# Patient Record
Sex: Male | Born: 1935 | Hispanic: No | Marital: Single | Smoking: Never smoker
Health system: Southern US, Community
[De-identification: ages and names within clinical notes are randomized; demographics above are authoritative.]

## PROBLEM LIST (undated history)

## (undated) DIAGNOSIS — H4089 Other specified glaucoma: Secondary | ICD-10-CM

## (undated) DIAGNOSIS — I10 Essential (primary) hypertension: Secondary | ICD-10-CM

## (undated) DIAGNOSIS — M1990 Unspecified osteoarthritis, unspecified site: Secondary | ICD-10-CM

## (undated) DIAGNOSIS — E119 Type 2 diabetes mellitus without complications: Secondary | ICD-10-CM

## (undated) DIAGNOSIS — J449 Chronic obstructive pulmonary disease, unspecified: Secondary | ICD-10-CM

## (undated) DIAGNOSIS — I639 Cerebral infarction, unspecified: Secondary | ICD-10-CM

---

## 2003-06-13 ENCOUNTER — Ambulatory Visit (HOSPITAL_BASED_OUTPATIENT_CLINIC_OR_DEPARTMENT_OTHER): Admission: RE | Admit: 2003-06-13 | Discharge: 2003-06-13 | Payer: Self-pay | Admitting: Surgery

## 2005-02-11 ENCOUNTER — Encounter: Admission: RE | Admit: 2005-02-11 | Discharge: 2005-02-11 | Payer: Self-pay | Admitting: Surgery

## 2005-03-11 ENCOUNTER — Encounter: Admission: RE | Admit: 2005-03-11 | Discharge: 2005-03-11 | Payer: Self-pay | Admitting: Surgery

## 2005-04-02 ENCOUNTER — Encounter: Admission: RE | Admit: 2005-04-02 | Discharge: 2005-04-02 | Payer: Self-pay | Admitting: Surgery

## 2005-12-21 ENCOUNTER — Inpatient Hospital Stay (HOSPITAL_COMMUNITY): Admission: RE | Admit: 2005-12-21 | Discharge: 2005-12-25 | Payer: Self-pay | Admitting: Orthopedic Surgery

## 2005-12-21 ENCOUNTER — Ambulatory Visit: Payer: Self-pay | Admitting: Cardiology

## 2017-12-09 ENCOUNTER — Ambulatory Visit
Admission: RE | Admit: 2017-12-09 | Discharge: 2017-12-09 | Disposition: A | Discharge status: Home / Self Care | Payer: Medicare Other | Source: Ambulatory Visit | Attending: Cardiology | Admitting: Cardiology

## 2017-12-09 ENCOUNTER — Other Ambulatory Visit: Payer: Self-pay | Admitting: Cardiology

## 2017-12-09 DIAGNOSIS — R42 Dizziness and giddiness: Secondary | ICD-10-CM | POA: Insufficient documentation

## 2017-12-09 DIAGNOSIS — I10 Essential (primary) hypertension: Secondary | ICD-10-CM | POA: Diagnosis present

## 2017-12-09 MED ORDER — GADOBENATE DIMEGLUMINE 529 MG/ML IV SOLN
16.0000 mL | Freq: Once | INTRAVENOUS | Status: AC | PRN
  Administered 2017-12-09: 16 mL via INTRAVENOUS

## 2018-05-19 ENCOUNTER — Other Ambulatory Visit: Payer: Self-pay | Admitting: Internal Medicine

## 2018-05-19 DIAGNOSIS — R110 Nausea: Secondary | ICD-10-CM

## 2018-05-19 DIAGNOSIS — R634 Abnormal weight loss: Secondary | ICD-10-CM

## 2018-05-19 DIAGNOSIS — R1013 Epigastric pain: Secondary | ICD-10-CM

## 2018-05-24 ENCOUNTER — Ambulatory Visit
Admission: RE | Admit: 2018-05-24 | Discharge: 2018-05-24 | Disposition: A | Discharge status: Home / Self Care | Payer: Medicare Other | Source: Ambulatory Visit | Attending: Internal Medicine | Admitting: Internal Medicine

## 2018-05-24 DIAGNOSIS — R1013 Epigastric pain: Secondary | ICD-10-CM | POA: Insufficient documentation

## 2018-05-24 DIAGNOSIS — R634 Abnormal weight loss: Secondary | ICD-10-CM | POA: Diagnosis present

## 2018-05-24 DIAGNOSIS — K4090 Unilateral inguinal hernia, without obstruction or gangrene, not specified as recurrent: Secondary | ICD-10-CM | POA: Insufficient documentation

## 2018-05-24 DIAGNOSIS — I700 Atherosclerosis of aorta: Secondary | ICD-10-CM | POA: Insufficient documentation

## 2018-05-24 DIAGNOSIS — R110 Nausea: Secondary | ICD-10-CM | POA: Diagnosis present

## 2018-05-24 HISTORY — DX: Type 2 diabetes mellitus without complications: E11.9

## 2018-05-24 MED ORDER — IOPAMIDOL (ISOVUE-300) INJECTION 61%
100.0000 mL | Freq: Once | INTRAVENOUS | Status: AC | PRN
  Administered 2018-05-24: 100 mL via INTRAVENOUS

## 2020-07-08 NOTE — Progress Notes (Signed)
07/09/2020 6:02 PM   Steven Gould [birth year] [id number]  Referring provider: No referring provider defined for this encounter. Chief Complaint  Patient presents with   Hematuria    HPI: Steven Gould is a 86 y.o. male with hematuria who presents today for evaluation and management.   He has had several episodes of terminal hematuria a few times over the past few months without any other symptoms.   Voiding well, no complaints.     Never smoker.  The patient underwent a prostate biopsy in 1999 which was unremarkable. Most recent PSA is 0.87 as of 06/28/2020.  He was last seen at [facility] urology in 2016. Cystoscopy at that time showed a left bladder wall small submucosal hemorrhage. CT urogram was unremarkable.  CT A/P with contrast on 05/24/2018 no acute findings in the abdomen or pelvis. Mildly prominent prostate. Small right inguinal hernia containing fat.  UA on 06/28/2020 showed 2 RBC.  PSA trend: 04/17/13: 0.7 01/25/14: 0.9 12/03/14: 1.0 01/10/16: 0.70 02/19/17: 0.79 05/12/18: 0.62 06/28/20: 0.87  Never smoker.  PMH: Past Medical History:  Diagnosis Date   Diabetes mellitus without complication ([place])    Takes Metformin    Surgical History: History reviewed. No pertinent surgical history.  Home Medications:  Allergies as of 07/09/2020   No Known Allergies     Medication List       Accurate as of July 09, 2020  6:02 PM. If you have any questions, ask your nurse or doctor.        aspirin 81 MG EC tablet Take by mouth.   Calcium 500 MG tablet Take by mouth.   Cholecalciferol 50 MCG (2000 UT) Caps Take by mouth.   donepezil 10 MG tablet Commonly known as: ARICEPT Take 10 mg by mouth daily.   esomeprazole 40 MG capsule Commonly known as: NEXIUM Take 40 mg by mouth daily.   Fish Oil 1000 MG Caps Take by mouth.   glimepiride 2 MG tablet Commonly known as: AMARYL Take 2 mg by mouth daily.   hydrochlorothiazide 25 MG  tablet Commonly known as: HYDRODIURIL TK 1 T PO ONCE DAILY   irbesartan 300 MG tablet Commonly known as: AVAPRO Take 300 mg by mouth daily.   metFORMIN 500 MG tablet Commonly known as: GLUCOPHAGE Take two tablets (1000 mg) twice daily.   metoprolol succinate 100 MG 24 hr tablet Commonly known as: TOPROL-XL Take by mouth.   Omnipaque 300 MG/ML solution Generic drug: iohexol   ONE TOUCH ULTRA 2 w/Device Kit Use once daily to check blood sugars   OneTouch Delica Lancets 01S Misc Use 1 each once daily.   pioglitazone 30 MG tablet Commonly known as: ACTOS Take 30 mg by mouth daily.   Precision QID Test test strip Generic drug: glucose blood Use once daily Use as instructed. CONTOUR NEXT TEST STRIPS   spironolactone 25 MG tablet Commonly known as: ALDACTONE Take 25 mg by mouth daily.   zolpidem 5 MG tablet Commonly known as: AMBIEN Take 1/2 - 1 tablet nightly as needed.       Allergies: No Known Allergies  Family History: History reviewed. No pertinent family history.  Social History:  has no history on file for tobacco use, alcohol use, and drug use.   Physical Exam: BP (!) 172/90    Pulse 66    Ht '5\' 9"'$  (1.753 m)    Wt 175 lb (79.4 kg)    BMI 25.84 kg/m   Constitutional:  Alert and oriented, No acute  distress. HEENT: [place] AT, moist mucus membranes.  Trachea midline, no masses. Cardiovascular: No clubbing, cyanosis, or edema. Respiratory: Normal respiratory effort, no increased work of breathing Skin: No rashes, bruises or suspicious lesions. Neurologic: Grossly intact, no focal deficits, moving all 4 extremities. Psychiatric: Normal mood and affect.  Urinalysis No microscopic hematuria  Assessment & Plan:    1. Gross hematuria  Suspected to be prostate related. Patient underwent extensive work up in 2016 (see above). Episodes happen only a few times terminally at the end of voiding. Patient has agreed to a cysto today. RUS for upper tract imaging     [facility] [address], [place], [place] [address] [phone]  I, Selena Batten, am acting as a scribe for Dr. Hollice Espy.  I have reviewed the above documentation for accuracy and completeness, and I agree with the above.   Hollice Espy, MD   I spent 45 total minutes on the day of the encounter including pre-visit review of the medical record ([facility]/ care everywhere), face-to-face time with the patient, and post visit ordering of labs/imaging/tests.

## 2020-07-09 ENCOUNTER — Encounter: Payer: Self-pay | Admitting: Urology

## 2020-07-09 ENCOUNTER — Ambulatory Visit (INDEPENDENT_AMBULATORY_CARE_PROVIDER_SITE_OTHER): Payer: Medicare Other | Admitting: Urology

## 2020-07-09 ENCOUNTER — Other Ambulatory Visit: Payer: Self-pay

## 2020-07-09 VITALS — BP 172/90 | HR 66 | Ht 69.0 in | Wt 175.0 lb

## 2020-07-09 DIAGNOSIS — R319 Hematuria, unspecified: Secondary | ICD-10-CM | POA: Diagnosis not present

## 2020-07-09 NOTE — Progress Notes (Signed)
   07/09/2020  CC:  Chief Complaint  Patient presents with  . Hematuria    HPI: Steven Gould is a 86 y.o. with gross hematuria who presents for a cystoscopy.   See associated note.   Blood pressure (!) 172/90, pulse 66, height 5\' 9"  (1.753 m), weight 175 lb (79.4 kg). NED. A&Ox3.   No respiratory distress   Abd soft, NT, ND Normal phallus with bilateral descended testicles  Cystoscopy Procedure Note  Patient identification was confirmed, informed consent was obtained, and patient was prepped using Betadine solution.  Lidocaine jelly was administered per urethral meatus.     Pre-Procedure: - Inspection reveals a normal caliber ureteral meatus.  Procedure: The flexible cystoscope was introduced without difficulty - No urethral strictures/lesions are present. - Mildly enlarged prostate  - Normal bladder neck - Bilateral ureteral orifices identified - Bladder mucosa  reveals no ulcers, tumors, or lesions - No bladder stones - No trabeculation  Retroflexion was unremarkable.    Post-Procedure: - Patient tolerated the procedure well  Assessment/ Plan:  1. Gross hematuria Cystoscopy is unremarkable    I, Selena Batten, am acting as a scribe for Dr. Hollice Espy.  I have reviewed the above documentation for accuracy and completeness, and I agree with the above.   Hollice Espy, MD

## 2020-07-11 LAB — MICROSCOPIC EXAMINATION: Bacteria, UA: NONE SEEN

## 2020-07-11 LAB — URINALYSIS, COMPLETE
Bilirubin, UA: NEGATIVE
Glucose, UA: NEGATIVE
Ketones, UA: NEGATIVE
Leukocytes,UA: NEGATIVE
Nitrite, UA: NEGATIVE
Protein,UA: NEGATIVE
Specific Gravity, UA: 1.02 (ref 1.005–1.030)
Urobilinogen, Ur: 0.2 mg/dL (ref 0.2–1.0)
pH, UA: 5 (ref 5.0–7.5)

## 2020-07-15 ENCOUNTER — Telehealth: Payer: Self-pay | Admitting: Urology

## 2020-07-15 NOTE — Telephone Encounter (Signed)
RUS is fine.  No pathology.  Incidental known peripelvic cysts (renal cysts).    Hollice Espy, MD

## 2020-07-23 NOTE — Telephone Encounter (Signed)
Patient informed, voiced understanding.  °

## 2022-01-20 ENCOUNTER — Ambulatory Visit
Admission: RE | Admit: 2022-01-20 | Discharge: 2022-01-20 | Disposition: A | Discharge status: Home / Self Care | Payer: Medicare Other | Source: Ambulatory Visit | Attending: Internal Medicine | Admitting: Internal Medicine

## 2022-01-20 ENCOUNTER — Other Ambulatory Visit: Payer: Self-pay | Admitting: Internal Medicine

## 2022-01-20 DIAGNOSIS — R634 Abnormal weight loss: Secondary | ICD-10-CM | POA: Insufficient documentation

## 2022-01-20 DIAGNOSIS — R630 Anorexia: Secondary | ICD-10-CM

## 2022-01-20 DIAGNOSIS — R1084 Generalized abdominal pain: Secondary | ICD-10-CM | POA: Diagnosis present

## 2022-01-20 DIAGNOSIS — R531 Weakness: Secondary | ICD-10-CM

## 2022-01-20 LAB — POCT I-STAT CREATININE: Creatinine, Ser: 1.5 mg/dL — ABNORMAL HIGH (ref 0.61–1.24)

## 2022-01-20 MED ORDER — IOHEXOL 300 MG/ML  SOLN
80.0000 mL | Freq: Once | INTRAMUSCULAR | Status: AC | PRN
  Administered 2022-01-20: 80 mL via INTRAVENOUS

## 2022-02-18 ENCOUNTER — Encounter: Payer: Self-pay | Admitting: Gastroenterology

## 2022-02-18 NOTE — H&P (Signed)
? ?Pre-Procedure H&P ?  ?Patient ID: Steven Gould is a 88 y.o. male. ? ?Gastroenterology Provider: Annamaria Helling, DO ? ?Referring Provider: Laurine Blazer, PA ?PCP: Patient, No Pcp Per (Inactive) ? ?Date: 02/19/2022 ? ?HPI ?Steven Gould is a 88 y.o. male who presents today for Esophagogastroduodenoscopy for early satiety, weight loss, abnormal CT imaging. ?Patient is a retired Research scientist (medical). ?Has noted for several months early satiety with decreasing appetite.  He has a weight loss of 20 to 30 pounds.  In March 2022 he weighed 169 pounds.  February of this year he was down to 144 pounds but has improved some at 151 pounds. ?He denies dysphagia odynophagia nausea vomiting and GERD symptoms.  He has no abdominal pain.  He has not regularly been on PPI in 10 years.  He denies any NSAID use. ?Bowel movements have been regular with no diarrhea constipation melena or hematochezia ? ?No personal history of polyps.  Last colonoscopy 9 years ago.  No family history of colorectal cancer or colon polyps ?Most recent lab values total bili 1.3 ferritin 266 hemoglobin 14.7 creatinine 1.5 MCV 97 platelets 177,000 ? ?No other acute GI complaints. ? ? He underwent CT scan demonstrating symmetric wall thickening of the distal esophagus.  Family history 1 brother with throat cancer another brother with gastric cancer. ? ? ? ?Past Medical History:  ?Diagnosis Date  ? Arthritis   ? COPD (chronic obstructive pulmonary disease) ([place])   ? Diabetes mellitus without complication ([place])   ? Takes Metformin  ? 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 Hypertension   ? Stroke [facility])   ? ? ?Past Surgical History:  ?Procedure Laterality Date  ? EYE SURGERY    ? glaucoma eye surgery  ? HERNIA REPAIR    ? JOINT REPLACEMENT    ? left hand ganglion cyst removal    ? TOTAL KNEE ARTHROPLASTY Right 2006  ? ? ?Family History ?1 brother with throat cancer, another brother with gastric cancer ?No other h/o GI  disease or malignancy ? ?Review of Systems  ?Constitutional:  Positive for appetite change (Diminished), fatigue and unexpected weight change (Lost 20-30 pounds). Negative for activity change, chills, diaphoresis and fever.  ?HENT:  Negative for trouble swallowing and voice change.   ?Respiratory:  Negative for shortness of breath and wheezing.   ?Cardiovascular:  Negative for chest pain, palpitations and leg swelling.  ?Gastrointestinal:  Negative for abdominal distention, abdominal pain, anal bleeding, blood in stool, constipation, diarrhea, nausea and vomiting.  ?     Early satiety  ?Musculoskeletal:  Negative for arthralgias and myalgias.  ?Skin:  Negative for color change and pallor.  ?Neurological:  Negative for dizziness, syncope and weakness.  ?Psychiatric/Behavioral:  Negative for confusion. The patient is not nervous/anxious.   ?All other systems reviewed and are negative.  ? ?Medications ?No current facility-administered medications on file prior to encounter.  ? ?Current Outpatient Medications on File Prior to Encounter  ?Medication Sig Dispense Refill  ? donepezil (ARICEPT) 10 MG tablet Take 10 mg by mouth daily.    ? glimepiride (AMARYL) 2 MG tablet Take 2 mg by mouth daily.    ? hydrochlorothiazide (HYDRODIURIL) 25 MG tablet TK 1 T PO ONCE DAILY    ? irbesartan (AVAPRO) 300 MG tablet Take 300 mg by mouth daily.    ? metFORMIN (GLUCOPHAGE) 500 MG tablet Take two tablets (1000 mg) twice daily.    ? metoprolol succinate (TOPROL-XL) 100 MG 24 hr tablet Take  by mouth.    ? mirtazapine (REMERON) 15 MG tablet Take 15 mg by mouth at bedtime.    ? Multiple Vitamin (MULTIVITAMIN) tablet Take 1 tablet by mouth daily.    ? aspirin 81 MG EC tablet Take by mouth.    ? Blood Glucose Monitoring Suppl (ONE TOUCH ULTRA 2) w/Device KIT Use once daily to check blood sugars    ? Calcium 500 MG tablet Take by mouth.    ? Cholecalciferol 50 MCG (2000 UT) CAPS Take by mouth.    ? esomeprazole (NEXIUM) 40 MG capsule Take 40  mg by mouth daily.    ? iohexol (OMNIPAQUE) 300 MG/ML solution     ? Omega-3 Fatty Acids (FISH OIL) 1000 MG CAPS Take by mouth.    ? OneTouch Delica Lancets 68L MISC Use 1 each once daily.    ? pioglitazone (ACTOS) 30 MG tablet Take 30 mg by mouth daily.    ? spironolactone (ALDACTONE) 25 MG tablet Take 25 mg by mouth daily.    ? zolpidem (AMBIEN) 5 MG tablet Take 1/2 - 1 tablet nightly as needed.    ? ? ?Pertinent medications related to GI and procedure were reviewed by me with the patient prior to the procedure ? ? ?Current Facility-Administered Medications:  ?  0.9 %  sodium chloride infusion, , Intravenous, Continuous, Annamaria Helling, DO ?  ?  ? ?No Known Allergies ?Allergies were reviewed by me prior to the procedure ? ?Objective  ? ? ?Vitals:  ? 02/19/22 0953  ?BP: (!) 165/88  ?Pulse: 65  ?Resp: 18  ?Temp: (!) 96.9 ?F (36.1 ?C)  ?TempSrc: Temporal  ?SpO2: 100%  ?Weight: 63.5 kg  ?Height: 5' 9.5" (1.765 m)  ? ? ? ?Physical Exam ?Vitals and nursing note reviewed.  ?Constitutional:   ?   General: He is not in acute distress. ?   Appearance: Normal appearance. He is not ill-appearing, toxic-appearing or diaphoretic.  ?HENT:  ?   Head: Normocephalic and atraumatic.  ?   Nose: Nose normal.  ?   Mouth/Throat:  ?   Mouth: Mucous membranes are moist.  ?   Pharynx: Oropharynx is clear.  ?Eyes:  ?   General: No scleral icterus. ?   Extraocular Movements: Extraocular movements intact.  ?Cardiovascular:  ?   Rate and Rhythm: Normal rate and regular rhythm.  ?   Heart sounds: Normal heart sounds. No murmur heard. ?  No friction rub. No gallop.  ?Pulmonary:  ?   Effort: Pulmonary effort is normal. No respiratory distress.  ?   Breath sounds: Normal breath sounds. No wheezing, rhonchi or rales.  ?Abdominal:  ?   General: Bowel sounds are normal. There is no distension.  ?   Palpations: Abdomen is soft.  ?   Tenderness: There is no abdominal tenderness. There is no guarding or rebound.  ?Musculoskeletal:  ?   Cervical  back: Neck supple.  ?   Right lower leg: No edema.  ?   Left lower leg: No edema.  ?Skin: ?   General: Skin is warm and dry.  ?   Coloration: Skin is not jaundiced or pale.  ?Neurological:  ?   General: No focal deficit present.  ?   Mental Status: He is alert and oriented to person, place, and time. Mental status is at baseline.  ?Psychiatric:     ?   Mood and Affect: Mood normal.     ?   Behavior: Behavior normal.     ?  Thought Content: Thought content normal.     ?   Judgment: Judgment normal.  ? ? ? ?Assessment:  ?Mr. Keylor Rands is a 88 y.o. male  who presents today for Esophagogastroduodenoscopy for early satiety, weight loss, abnormal CT imaging. ? ?Plan:  ?Esophagogastroduodenoscopy with possible intervention today ? ?Esophagogastroduodenoscopy with possible biopsy, control of bleeding, polypectomy, and interventions as necessary has been discussed with the patient/patient representative. Informed consent was obtained from the patient/patient representative after explaining the indication, nature, and risks of the procedure including but not limited to death, bleeding, perforation, missed neoplasm/lesions, cardiorespiratory compromise, and reaction to medications. Opportunity for questions was given and appropriate answers were provided. Patient/patient representative has verbalized understanding is amenable to undergoing the procedure. ? ? ?Annamaria Helling, DO  ?[facility] ? ?Portions of the record may have been created with voice recognition software. Occasional wrong-word or 'sound-a-like' substitutions may have occurred due to the inherent limitations of voice recognition software.  Read the chart carefully and recognize, using context, where substitutions may have occurred. ?

## 2022-02-19 ENCOUNTER — Ambulatory Visit: Payer: Medicare Other | Admitting: Anesthesiology

## 2022-02-19 ENCOUNTER — Ambulatory Visit
Admission: RE | Admit: 2022-02-19 | Discharge: 2022-02-19 | Disposition: A | Discharge status: Home / Self Care | Payer: Medicare Other | Source: Ambulatory Visit | Attending: Gastroenterology | Admitting: Gastroenterology

## 2022-02-19 ENCOUNTER — Encounter: Payer: Self-pay | Admitting: Gastroenterology

## 2022-02-19 ENCOUNTER — Encounter
Admission: RE | Disposition: A | Discharge status: Home / Self Care | Payer: Self-pay | Source: Ambulatory Visit | Attending: Gastroenterology

## 2022-02-19 DIAGNOSIS — R933 Abnormal findings on diagnostic imaging of other parts of digestive tract: Secondary | ICD-10-CM | POA: Diagnosis present

## 2022-02-19 DIAGNOSIS — R634 Abnormal weight loss: Secondary | ICD-10-CM | POA: Diagnosis not present

## 2022-02-19 DIAGNOSIS — K2980 Duodenitis without bleeding: Secondary | ICD-10-CM | POA: Diagnosis not present

## 2022-02-19 DIAGNOSIS — K2950 Unspecified chronic gastritis without bleeding: Secondary | ICD-10-CM | POA: Diagnosis not present

## 2022-02-19 DIAGNOSIS — E119 Type 2 diabetes mellitus without complications: Secondary | ICD-10-CM | POA: Diagnosis not present

## 2022-02-19 DIAGNOSIS — I10 Essential (primary) hypertension: Secondary | ICD-10-CM | POA: Insufficient documentation

## 2022-02-19 DIAGNOSIS — Z6820 Body mass index (BMI) 20.0-20.9, adult: Secondary | ICD-10-CM | POA: Insufficient documentation

## 2022-02-19 DIAGNOSIS — Z7984 Long term (current) use of oral hypoglycemic drugs: Secondary | ICD-10-CM | POA: Insufficient documentation

## 2022-02-19 DIAGNOSIS — Z800 Family history of malignant neoplasm of digestive organs: Secondary | ICD-10-CM | POA: Insufficient documentation

## 2022-02-19 DIAGNOSIS — K3189 Other diseases of stomach and duodenum: Secondary | ICD-10-CM | POA: Diagnosis not present

## 2022-02-19 DIAGNOSIS — Z8673 Personal history of transient ischemic attack (TIA), and cerebral infarction without residual deficits: Secondary | ICD-10-CM | POA: Diagnosis not present

## 2022-02-19 DIAGNOSIS — D132 Benign neoplasm of duodenum: Secondary | ICD-10-CM | POA: Diagnosis not present

## 2022-02-19 DIAGNOSIS — K317 Polyp of stomach and duodenum: Secondary | ICD-10-CM | POA: Diagnosis not present

## 2022-02-19 DIAGNOSIS — R6881 Early satiety: Secondary | ICD-10-CM | POA: Insufficient documentation

## 2022-02-19 HISTORY — DX: Cerebral infarction, unspecified: I63.9

## 2022-02-19 HISTORY — DX: Essential (primary) hypertension: I10

## 2022-02-19 HISTORY — DX: Chronic obstructive pulmonary disease, unspecified: J44.9

## 2022-02-19 HISTORY — DX: Unspecified osteoarthritis, unspecified site: M19.90

## 2022-02-19 HISTORY — PX: ESOPHAGOGASTRODUODENOSCOPY (EGD) WITH PROPOFOL: SHX5813

## 2022-02-19 HISTORY — DX: Other specified glaucoma: H40.89

## 2022-02-19 LAB — GLUCOSE, CAPILLARY: Glucose-Capillary: 140 mg/dL — ABNORMAL HIGH (ref 70–99)

## 2022-02-19 SURGERY — ESOPHAGOGASTRODUODENOSCOPY (EGD) WITH PROPOFOL
Anesthesia: General

## 2022-02-19 MED ORDER — PROPOFOL 500 MG/50ML IV EMUL
INTRAVENOUS | Status: AC
  Filled 2022-02-19: qty 50

## 2022-02-19 MED ORDER — EPHEDRINE 5 MG/ML INJ
INTRAVENOUS | Status: AC
  Filled 2022-02-19: qty 5

## 2022-02-19 MED ORDER — PROPOFOL 500 MG/50ML IV EMUL
INTRAVENOUS | Status: DC | PRN
  Administered 2022-02-19: 150 ug/kg/min via INTRAVENOUS

## 2022-02-19 MED ORDER — LIDOCAINE HCL (PF) 2 % IJ SOLN
INTRAMUSCULAR | Status: AC
  Filled 2022-02-19: qty 5

## 2022-02-19 MED ORDER — EPHEDRINE SULFATE (PRESSORS) 50 MG/ML IJ SOLN
INTRAMUSCULAR | Status: DC | PRN
  Administered 2022-02-19 (×2): 5 mg via INTRAVENOUS

## 2022-02-19 MED ORDER — LIDOCAINE HCL (CARDIAC) PF 100 MG/5ML IV SOSY
PREFILLED_SYRINGE | INTRAVENOUS | Status: DC | PRN
Start: 2022-02-19 — End: 2022-02-19
  Administered 2022-02-19: 50 mg via INTRAVENOUS

## 2022-02-19 MED ORDER — SODIUM CHLORIDE 0.9 % IV SOLN: INTRAVENOUS | Status: DC

## 2022-02-19 NOTE — Interval H&P Note (Signed)
History and Physical Interval Note: Preprocedure H&P from 02/19/22 ? was reviewed and there was no interval change after seeing and examining the patient.  Written consent was obtained from the patient after discussion of risks, benefits, and alternatives. Patient has consented to proceed with Esophagogastroduodenoscopy with possible intervention ? ? ?02/19/2022 ?10:17 AM ? ?Steven Gould  has presented today for surgery, with the diagnosis of ABNORMAL CT OF GI TRACT.  The various methods of treatment have been discussed with the patient and family. After consideration of risks, benefits and other options for treatment, the patient has consented to  Procedure(s) with comments: ?ESOPHAGOGASTRODUODENOSCOPY (EGD) WITH PROPOFOL (N/A) - DM as a surgical intervention.  The patient's history has been reviewed, patient examined, no change in status, stable for surgery.  I have reviewed the patient's chart and labs.  Questions were answered to the patient's satisfaction.   ? ? ?Annamaria Helling ? ? ?

## 2022-02-19 NOTE — Transfer of Care (Signed)
Immediate Anesthesia Transfer of Care Note ? ?Patient: Steven Gould ? ?Procedure(s) Performed: ESOPHAGOGASTRODUODENOSCOPY (EGD) WITH PROPOFOL ? ?Patient Location: PACU ? ?Anesthesia Type:General ? ?Level of Consciousness: awake and sedated ? ?Airway & Oxygen Therapy: Patient Spontanous Breathing and Patient connected to nasal cannula oxygen ? ?Post-op Assessment: Report given to RN and Post -op Vital signs reviewed and stable ? ?Post vital signs: Reviewed and stable ? ?Last Vitals:  ?Vitals Value Taken Time  ?BP    ?Temp    ?Pulse    ?Resp    ?SpO2    ? ? ?Last Pain:  ?Vitals:  ? 02/19/22 0953  ?TempSrc: Temporal  ?PainSc: 0-No pain  ?   ? ?  ? ?Complications: No notable events documented. ?

## 2022-02-19 NOTE — Op Note (Signed)
[facility] ?Gastroenterology ?Patient Name: Steven Gould ?Procedure Date: 02/19/2022 10:10 AM ?MRN: [id number] ?Account #: [id number] ?Date of Birth: [birth year] ?Admit Type: Outpatient ?Age: 88 ?Room: [facility] ENDO ROOM 1 ?Gender: Male ?Note Status: Finalized ?Instrument Name: Upper Endoscope [id number] ?Procedure:             Upper GI endoscopy ?Indications:           Abnormal CT of the GI tract ?Providers:             Annamaria Helling DO, DO ?Referring MD:          Tracie Harrier, MD (Referring MD) ?Medicines:             Monitored Anesthesia Care ?Complications:         No immediate complications. Estimated blood loss:  ?                       Minimal. ?Procedure:             Pre-Anesthesia Assessment: ?                       - Prior to the procedure, a History and Physical was  ?                       performed, and patient medications and allergies were  ?                       reviewed. The patient is competent. The risks and  ?                       benefits of the procedure and the sedation options and  ?                       risks were discussed with the patient. All questions  ?                       were answered and informed consent was obtained.  ?                       Patient identification and proposed procedure were  ?                       verified by the physician, the nurse, the anesthetist  ?                       and the technician in the endoscopy suite. Mental  ?                       Status Examination: alert and oriented. Airway  ?                       Examination: normal oropharyngeal airway and neck  ?                       mobility. Respiratory Examination: clear to  ?                       auscultation. CV Examination: RRR, no murmurs, no S3  ?  or S4. Prophylactic Antibiotics: The patient does not  ?                       require prophylactic antibiotics. Prior  ?                       Anticoagulants: The patient has taken no previous  ?                        anticoagulant or antiplatelet agents. ASA Grade  ?                       Assessment: III - A patient with severe systemic  ?                       disease. After reviewing the risks and benefits, the  ?                       patient was deemed in satisfactory condition to  ?                       undergo the procedure. The anesthesia plan was to use  ?                       monitored anesthesia care (MAC). Immediately prior to  ?                       administration of medications, the patient was  ?                       re-assessed for adequacy to receive sedatives. The  ?                       heart rate, respiratory rate, oxygen saturations,  ?                       blood pressure, adequacy of pulmonary ventilation, and  ?                       response to care were monitored throughout the  ?                       procedure. The physical status of the patient was  ?                       re-assessed after the procedure. ?                       After obtaining informed consent, the endoscope was  ?                       passed under direct vision. Throughout the procedure,  ?                       the patient's blood pressure, pulse, and oxygen  ?                       saturations were monitored continuously. The Endoscope  ?  was introduced through the mouth, and advanced to the  ?                       second part of duodenum. The upper GI endoscopy was  ?                       accomplished without difficulty. The patient tolerated  ?                       the procedure well. ?Findings: ?     A single 14 to 16 mm sessile polyp with no bleeding was found in the  ?     second portion of the duodenum. Biopsies were taken with a cold forceps  ?     for histology. Estimated blood loss was minimal. ?     The duodenal bulb, first portion of the duodenum and second portion of  ?     the duodenum were normal. Biopsies for histology were taken with a cold  ?     forceps for  evaluation of celiac disease. Estimated blood loss was  ?     minimal. ?     Localized mild inflammation characterized by erosions and erythema was  ?     found in the gastric antrum. Biopsies were taken with a cold forceps for  ?     Helicobacter pylori testing. Estimated blood loss was minimal. ?     Multiple 1 to 3 mm sessile polyps with no bleeding and no stigmata of  ?     recent bleeding were found on the greater curvature of the stomach.  ?     Biopsies were taken with a cold forceps for histology. Estimated blood  ?     loss was minimal. ?     Diffuse mildly congested mucosa was found in the gastric body. Estimated  ?     blood loss: none. ?     The Z-line was regular. Estimated blood loss: none. ?     Esophagogastric landmarks were identified: the gastroesophageal junction  ?     was found at 38 cm from the incisors. ?     Normal mucosa was found in the entire esophagus. Prominent venous plexus  ?     note in the upper esophagus (~20-25 cm from incisors). No recent signs  ?     of bleeding. Estimated blood loss: none. ?Impression:            - A single duodenal polyp. Biopsied. ?                       - Normal duodenal bulb, first portion of the duodenum  ?                       and second portion of the duodenum. Biopsied. ?                       - Gastritis. Biopsied. ?                       - Multiple gastric polyps. Biopsied. ?                       - Congestive gastropathy. ?                       -  Z-line regular. ?                       - Esophagogastric landmarks identified. ?                       - Normal mucosa was found in the entire esophagus. ?Recommendation:        - Discharge patient to home. ?                       - Resume previous diet. ?                       - Continue present medications. ?                       - Await pathology results. ?                       - Return to GI clinic as previously scheduled. ?                       - The findings and recommendations were discussed with   ?                       the patient. ?Procedure Code(s):     --- Professional --- ?                       [phone], Esophagogastroduodenoscopy, flexible,  ?                       transoral; with biopsy, single or multiple ?Diagnosis Code(s):     --- Professional --- ?                       K31.7, Polyp of stomach and duodenum ?                       K29.70, Gastritis, unspecified, without bleeding ?                       K31.89, Other diseases of stomach and duodenum ?                       R93.3, Abnormal findings on diagnostic imaging of  ?                       other parts of digestive tract ?CPT copyright 2019 [employer]. All rights reserved. ?The codes documented in this report are preliminary and upon coder review may  ?be revised to meet current compliance requirements. ?Attending Participation: ?     I personally performed the entire procedure. ?Volney American, DO ?Annamaria Helling DO, DO ?02/19/2022 11:00:38 AM ?This report has been signed electronically. ?Number of Addenda: 0 ?Note Initiated On: 02/19/2022 10:10 AM ?Estimated Blood Loss:  Estimated blood loss was minimal. ?     [place] ?

## 2022-02-19 NOTE — Anesthesia Preprocedure Evaluation (Signed)
Anesthesia Evaluation  ?Patient identified by MRN, date of birth, ID band ?Patient awake ? ? ? ?Reviewed: ?Allergy & Precautions, H&P , NPO status , Patient's Chart, lab work & pertinent test results, reviewed documented beta blocker date and time  ? ?Airway ?Mallampati: II ? ? ?Neck ROM: full ? ? ? Dental ? ?(+) Poor Dentition ?  ?Pulmonary ?COPD,  ?  ?Pulmonary exam normal ? ? ? ? ? ? ? Cardiovascular ?Exercise Tolerance: Good ?hypertension, On Medications ?negative cardio ROS ?Normal cardiovascular exam ?Rhythm:regular Rate:Normal ? ? ?  ?Neuro/Psych ?CVA, No Residual Symptoms negative psych ROS  ? GI/Hepatic ?negative GI ROS, Neg liver ROS,   ?Endo/Other  ?negative endocrine ROSdiabetes, Well Controlled, Type 2, Oral Hypoglycemic Agents ? Renal/GU ?negative Renal ROS  ?negative genitourinary ?  ?Musculoskeletal ? ? Abdominal ?  ?Peds ? Hematology ?negative hematology ROS ?(+)   ?Anesthesia Other Findings ?Past Medical History: ?No date: Arthritis ?No date: COPD (chronic obstructive pulmonary disease) ([place]) ?No date: Diabetes mellitus without complication ([place]) ?    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    Comment:  glaucoma eye surgery ?No date: HERNIA REPAIR ?No date: JOINT REPLACEMENT ?No date: left hand ganglion cyst removal ?2006: [place]; Right ?BMI   ? Body Mass Index: 20.38 kg/m?  ?  ? Reproductive/Obstetrics ?negative OB ROS ? ?  ? ? ? ? ? ? ? ? ? ? ? ? ? ?  ?  ? ? ? ? ? ? ? ? ?Anesthesia Physical ?Anesthesia Plan ? ?ASA: 3 ? ?Anesthesia Plan: General  ? ?Post-op Pain Management:   ? ?Induction:  ? ?PONV Risk Score and Plan:  ? ?Airway Management Planned:  ? ?Additional Equipment:  ? ?Intra-op Plan:  ? ?Post-operative Plan:  ? ?Informed Consent: I have reviewed the patients History and Physical, chart, labs and discussed  the procedure including the risks, benefits and alternatives for the proposed anesthesia with the patient or authorized representative who has indicated his/her understanding and acceptance.  ? ? ? ?Dental Advisory Given ? ?Plan Discussed with: CRNA ? ?Anesthesia Plan Comments:   ? ? ? ? ? ? ?Anesthesia Quick Evaluation ? ?

## 2022-02-20 ENCOUNTER — Encounter: Payer: Self-pay | Admitting: Gastroenterology

## 2022-02-20 LAB — SURGICAL PATHOLOGY

## 2022-02-25 NOTE — Anesthesia Postprocedure Evaluation (Signed)
Anesthesia Post Note ? ?Patient: Steven Gould ? ?Procedure(s) Performed: ESOPHAGOGASTRODUODENOSCOPY (EGD) WITH PROPOFOL ? ?Patient location during evaluation: PACU ?Anesthesia Type: General ?Level of consciousness: awake and alert ?Pain management: pain level controlled ?Vital Signs Assessment: post-procedure vital signs reviewed and stable ?Respiratory status: spontaneous breathing, nonlabored ventilation, respiratory function stable and patient connected to nasal cannula oxygen ?Cardiovascular status: blood pressure returned to baseline and stable ?Postop Assessment: no apparent nausea or vomiting ?Anesthetic complications: no ? ? ?No notable events documented. ? ? ?Last Vitals:  ?Vitals:  ? 02/19/22 1056 02/19/22 1116  ?BP: (!) 108/54 136/72  ?Pulse:    ?Resp:    ?Temp:    ?SpO2:    ?  ?Last Pain:  ?Vitals:  ? 02/19/22 1116  ?TempSrc:   ?PainSc: 0-No pain  ? ? ?  ?  ?  ?  ?  ?  ? ?Molli Barrows ? ? ? ? ?

## 2022-12-21 IMAGING — CT CT ABD-PELV W/ CM
2 of 5 series · 15 of 46 positions shown, 17 images · IV contrast (APPLIED)
Comparison: May 24, 2018

CLINICAL DATA: Unexplained weight loss, 30 pounds in 2-3 months.

EXAM:
CT ABDOMEN AND PELVIS WITH CONTRAST
TECHNIQUE: Multidetector CT imaging of the abdomen and pelvis was performed
using the standard protocol following bolus administration of
intravenous contrast.

[Series 2: routine abd/pel with · axial · 0.66mm/px · z∈[-549,-124]mm · 12 of 97 slices shown, 14 images]
[im 6/97  soft-tissue]
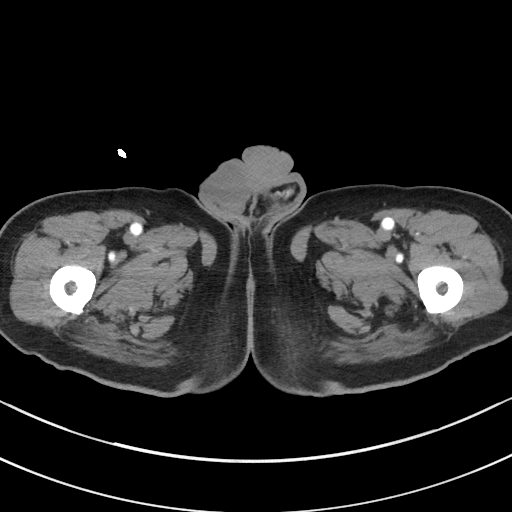
[im 6/97  bone]
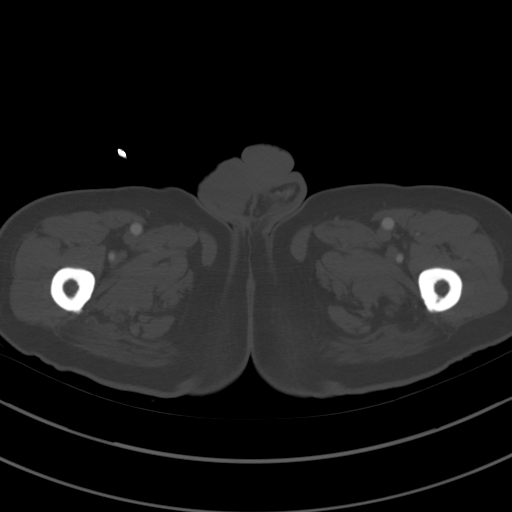
[im 16/97  soft-tissue]
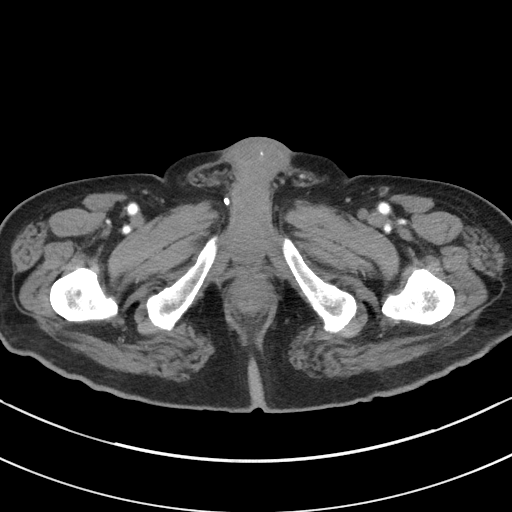
[im 21/97  soft-tissue]
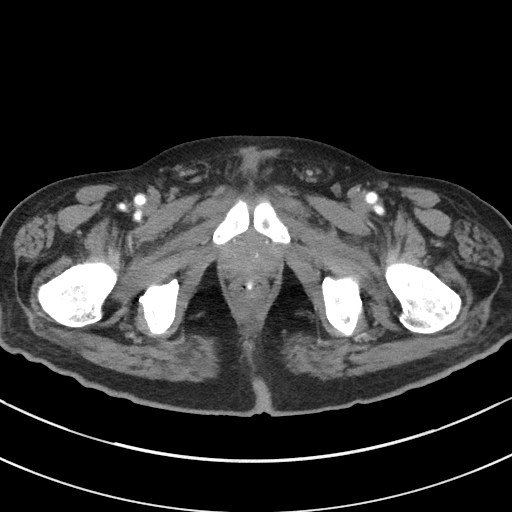
[im 31/97  soft-tissue]
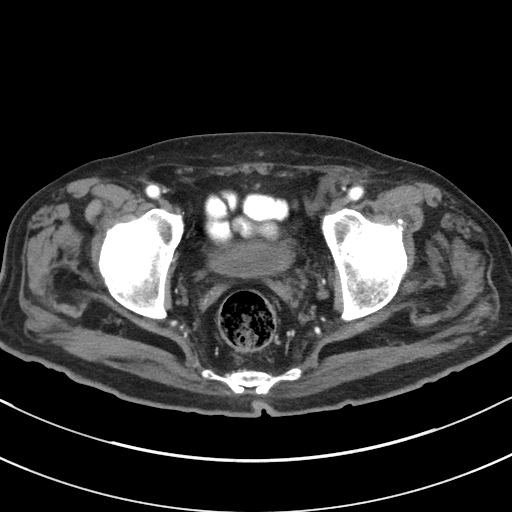
[im 36/97  soft-tissue]
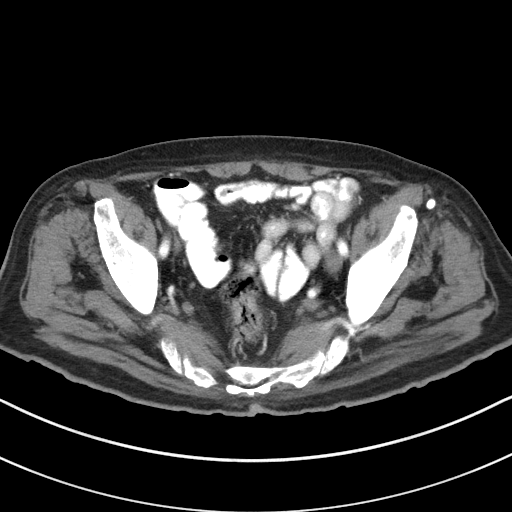
[im 46/97  soft-tissue]
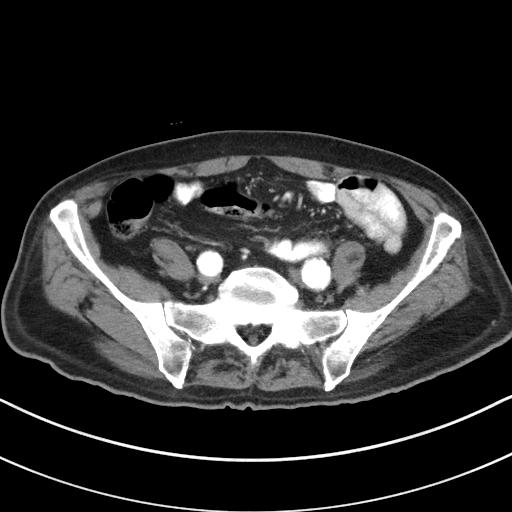
[im 51/97  soft-tissue]
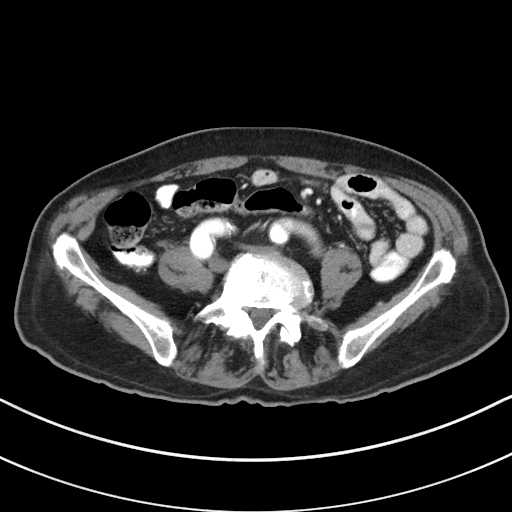
[im 61/97  soft-tissue]
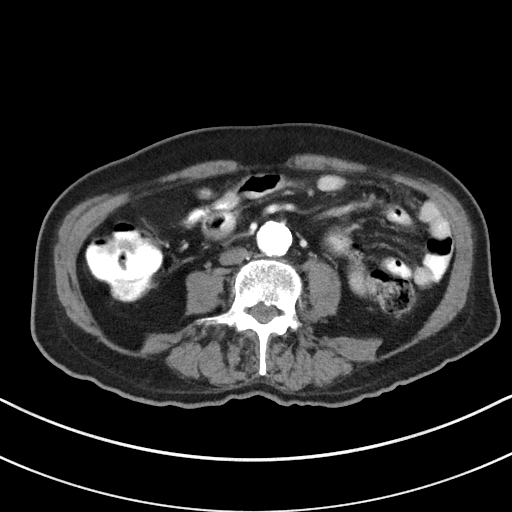
[im 66/97  soft-tissue]
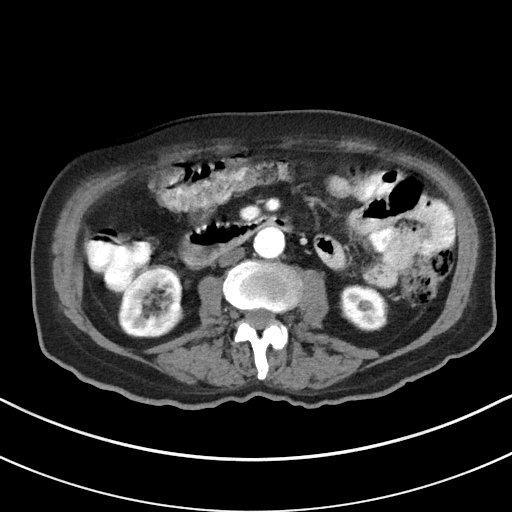
[im 66/97  bone]
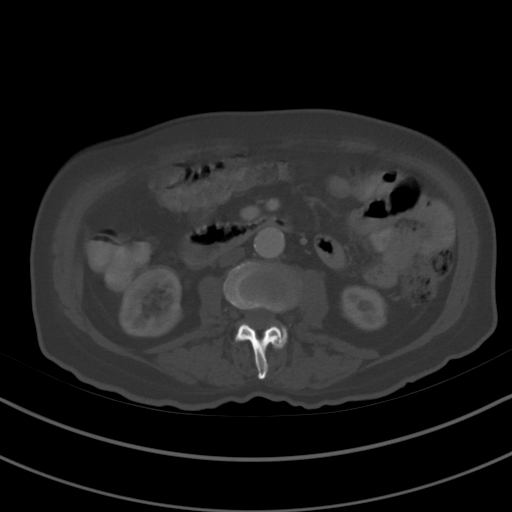
[im 76/97  soft-tissue]
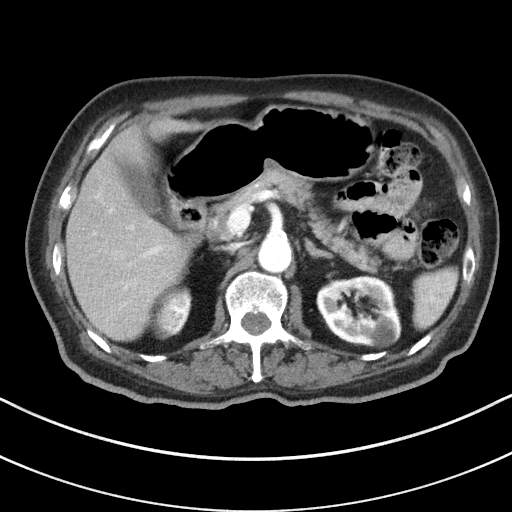
[im 81/97  soft-tissue]
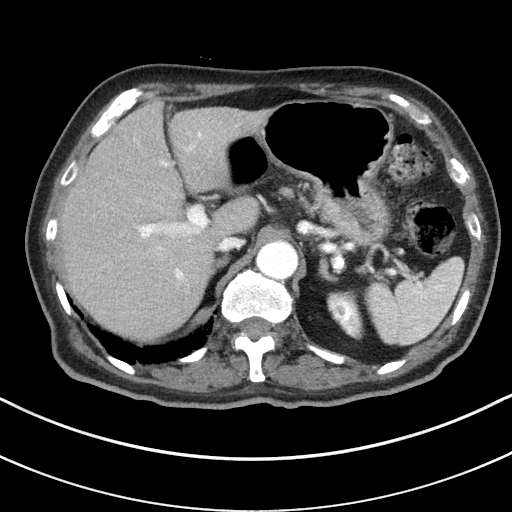
[im 91/97  soft-tissue]
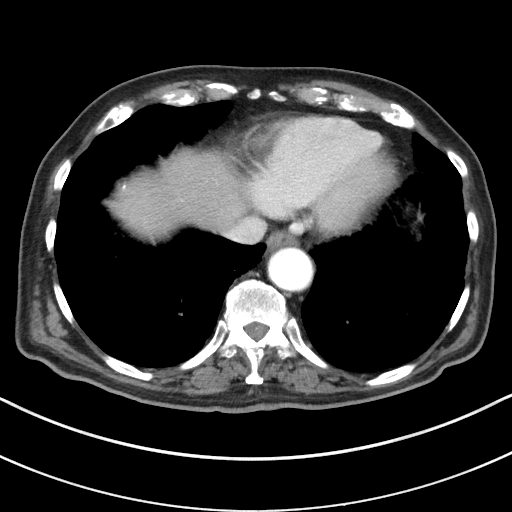

[Series 5: coronal st · coronal · 0.75mm/px · 3 of 81 slices shown]
[im 27/81  soft-tissue]
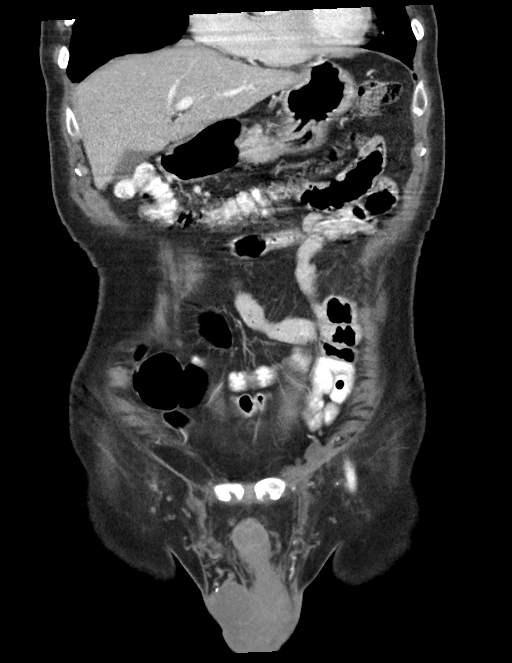
[im 36/81  soft-tissue]
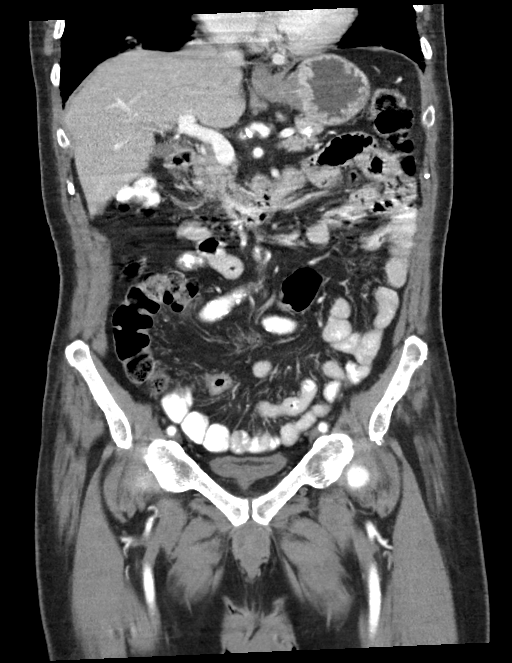
[im 45/81  soft-tissue]
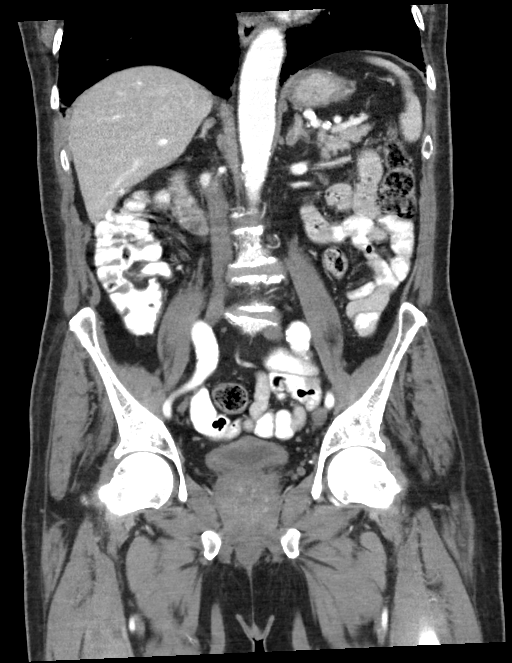

[15 of 46 positions shown; findings below may reference images not displayed]

RADIATION DOSE REDUCTION: This exam was performed according to the
departmental dose-optimization program which includes automated
exposure control, adjustment of the mA and/or kV according to
patient size and/or use of iterative reconstruction technique.

CONTRAST:  80mL OMNIPAQUE IOHEXOL 300 MG/ML  SOLN
FINDINGS: Lower chest: Mild symmetric wall thickening of the distal
esophagus.

Hepatobiliary: No suspicious hepatic lesion. Gallbladder is
unremarkable. No biliary ductal dilation.

Pancreas: No pancreatic ductal dilation or evidence of acute
inflammation.

Spleen: Normal in size without focal abnormality.

Adrenals/Urinary Tract: Similar nodular thickening of the left
adrenal gland. Right adrenal gland appears normal. No
hydronephrosis. Hypodense 16 mm left interpolar renal cyst. Kidneys
demonstrate symmetric enhancement and excretion of contrast
material. No solid enhancing renal mass. Symmetric wall thickening
of an incompletely distended urinary bladder.

Stomach/Bowel: Radiopaque enteric contrast material traverses the
hepatic flexure. Stomach is moderately distended with ingested
material and gas without wall thickening. No pathologic dilation of
small or large bowel. Terminal ileum appears normal. Appendix is not
confidently identified. Colonic diverticulosis without findings of
acute diverticulitis.

Vascular/Lymphatic: Aortic atherosclerosis without abdominal aortic
aneurysm. Similar aneurysmal dilation of the bilateral common iliac
vessels measuring 18 mm on the left and 16 mm on the right. No
pathologically enlarged abdominal or pelvic lymph nodes.

Reproductive: Enlarged prostate gland.

Other: No significant abdominopelvic free fluid.

Musculoskeletal: Multilevel degenerative changes spine. No
aggressive lytic or blastic lesion of bone. No acute osseous
abnormality.
IMPRESSION: Motion degraded examination.  Within this context:

1. Symmetric wall thickening of an incompletely distended urinary
bladder. Correlate with urinalysis to exclude cystitis.
2. Mild symmetric wall thickening of the distal esophagus,
nonspecific possibly reflecting esophagitis. Consider further
evaluation with upper endoscopy if clinically indicated.
3. Colonic diverticulosis without findings of acute diverticulitis.
4. Similar aneurysmal dilation of the bilateral common iliac vessels
measuring 18 mm on the left and 16 mm on the right.
5. Enlarged prostate gland.
6.  Aortic Atherosclerosis (NS3K3-NNS.S).
